# Patient Record
Sex: Male | Born: 1980 | Race: White | Hispanic: Yes | Marital: Single | Smoking: Never smoker
Health system: Southern US, Community
[De-identification: ages and names within clinical notes are randomized; demographics above are authoritative.]

---

## 2019-11-04 ENCOUNTER — Encounter (HOSPITAL_COMMUNITY): Payer: Self-pay | Admitting: Emergency Medicine

## 2019-11-04 ENCOUNTER — Emergency Department (HOSPITAL_COMMUNITY): Payer: Self-pay

## 2019-11-04 ENCOUNTER — Emergency Department (HOSPITAL_COMMUNITY)
Admission: EM | Admit: 2019-11-04 | Discharge: 2019-11-04 | Disposition: A | Discharge status: Home / Self Care | Payer: Self-pay | Attending: Emergency Medicine | Admitting: Emergency Medicine

## 2019-11-04 ENCOUNTER — Other Ambulatory Visit: Payer: Self-pay

## 2019-11-04 DIAGNOSIS — Z23 Encounter for immunization: Secondary | ICD-10-CM | POA: Insufficient documentation

## 2019-11-04 DIAGNOSIS — Y9389 Activity, other specified: Secondary | ICD-10-CM | POA: Insufficient documentation

## 2019-11-04 DIAGNOSIS — W270XXA Contact with workbench tool, initial encounter: Secondary | ICD-10-CM | POA: Insufficient documentation

## 2019-11-04 DIAGNOSIS — S61411A Laceration without foreign body of right hand, initial encounter: Secondary | ICD-10-CM | POA: Insufficient documentation

## 2019-11-04 DIAGNOSIS — Y9259 Other trade areas as the place of occurrence of the external cause: Secondary | ICD-10-CM | POA: Insufficient documentation

## 2019-11-04 DIAGNOSIS — Y990 Civilian activity done for income or pay: Secondary | ICD-10-CM | POA: Insufficient documentation

## 2019-11-04 MED ORDER — OXYCODONE-ACETAMINOPHEN 5-325 MG PO TABS
1.0000 | ORAL_TABLET | Freq: Once | ORAL | Status: AC
  Administered 2019-11-04: 1 via ORAL
  Filled 2019-11-04: qty 1

## 2019-11-04 MED ORDER — IBUPROFEN 600 MG PO TABS
600.0000 mg | ORAL_TABLET | Freq: Four times a day (QID) | ORAL | 0 refills | Status: AC | PRN
Start: 2019-11-04 — End: ?

## 2019-11-04 MED ORDER — LIDOCAINE-EPINEPHRINE (PF) 2 %-1:200000 IJ SOLN
20.0000 mL | Freq: Once | INTRAMUSCULAR | Status: AC
  Administered 2019-11-04: 20 mL
  Filled 2019-11-04: qty 20

## 2019-11-04 MED ORDER — TETANUS-DIPHTH-ACELL PERTUSSIS 5-2.5-18.5 LF-MCG/0.5 IM SUSP
0.5000 mL | Freq: Once | INTRAMUSCULAR | Status: AC
  Administered 2019-11-04: 0.5 mL via INTRAMUSCULAR
  Filled 2019-11-04: qty 0.5

## 2019-11-04 NOTE — Discharge Instructions (Signed)
Please follow-up at urgent care center in 7 days to have your sutures removed.  Take ibuprofen as needed for pain.  Monitor wound closely for any signs of infection.

## 2019-11-04 NOTE — ED Provider Notes (Signed)
[facility] EMERGENCY DEPARTMENT Provider Note   CSN: [id number] Arrival date & time: 11/04/19  1340     History Chief Complaint  Patient presents with  . Extremity Laceration    Steven Rich is a 39 y.o. male.  The history is provided by the patient. The history is limited by a language barrier. A language interpreter was used.     39 year old Hispanic male presented to ED for evaluation of head injury.  History obtained through language interpreter over the phone.  Patient report approximately 4 hours ago he was at work, cutting words using a hand saw.  He report the saw struck a nail with the wood and bounced back striking his hand.  He suffered a laceration to the volar aspect of the right hand.  He report acute onset of pain.  Described as "it just hurts".  Denies any numbness.  Unable to recall last tetanus status.  He report his wounds were washed out by the nurse once he arrived here.  He is left-hand dominant.  He denies any other injury.  Pain is nonradiating.    History reviewed. No pertinent past medical history.  There are no problems to display for this patient.   History reviewed. No pertinent surgical history.     No family history on file.  Social History   Tobacco Use  . Smoking status: Never Smoker  . Smokeless tobacco: Never Used  Substance Use Topics  . Alcohol use: Not Currently  . Drug use: Not Currently    Home Medications Prior to Admission medications   Not on File    Allergies    Patient has no allergy information on record.  Review of Systems   Review of Systems  Constitutional: Negative for fever.  Skin: Positive for wound.  Neurological: Negative for numbness.    Physical Exam Updated Vital Signs BP (!) 128/92 (BP Location: Right Arm)   Pulse 78   Temp 98.5 F (36.9 C) (Oral)   Resp 16   Ht 5\' 2"  (1.575 m)   Wt 73.5 kg   SpO2 100%   BMI 29.63 kg/m   Physical Exam Vitals and nursing note  reviewed.  Constitutional:      General: He is not in acute distress.    Appearance: He is well-developed.  HENT:     Head: Atraumatic.  Eyes:     Conjunctiva/sclera: Conjunctivae normal.  Musculoskeletal:        General: Signs of injury (Right hand: There is a 4 cm laceration noted to the thenar eminence on the hand with tenderness to palpation but intact distal sensation.  Able to move all fingers with slightly decreased thumb adduction secondary to pain.) present.     Cervical back: Neck supple.  Skin:    Findings: No rash.  Neurological:     Mental Status: He is alert.     ED Results / Procedures / Treatments   Labs (all labs ordered are listed, but only abnormal results are displayed) Labs Reviewed - No data to display  EKG None  Radiology DG Hand Complete Right  Result Date: 11/04/2019 CLINICAL DATA:  Laceration EXAM: RIGHT HAND - COMPLETE 3+ VIEW COMPARISON:  None. FINDINGS: No acute displaced fracture or malalignment. No radiopaque foreign body in the soft tissues. Lucent deformities within the second distal phalanx involving the tuft with small lucent lesion at the base of the distal phalanx. IMPRESSION: 1. No acute osseous abnormality. 2. Deformity of the  second distal phalanx, mostly involving the tuft. Findings could be secondary to previous trauma or potential tuft mass such as inclusion cyst. Follow-up nonemergent cross-sectional imaging as clinically indicated. Electronically Signed   By: Jasmine Pang M.D.   On: 11/04/2019 18:38    Procedures .Marland KitchenLaceration Repair  Date/Time: 11/04/2019 6:38 PM Performed by: Fayrene Helper, PA-C Authorized by: Fayrene Helper, PA-C   Consent:    Consent obtained:  Verbal   Consent given by:  Patient   Risks discussed:  Infection, need for additional repair, pain, poor cosmetic result and poor wound healing   Alternatives discussed:  No treatment and delayed treatment Universal protocol:    Procedure explained and questions answered to  patient or proxy's satisfaction: yes     Relevant documents present and verified: yes     Test results available and properly labeled: yes     Imaging studies available: yes     Required blood products, implants, devices, and special equipment available: yes     Site/side marked: yes     Immediately prior to procedure, a time out was called: yes     Patient identity confirmed:  Verbally with patient Anesthesia (see MAR for exact dosages):    Anesthesia method:  Local infiltration   Local anesthetic:  Lidocaine 2% WITH epi Laceration details:    Location:  Hand   Hand location:  R palm   Length (cm):  4   Depth (mm):  3 Repair type:    Repair type:  Complex Pre-procedure details:    Preparation:  Patient was prepped and draped in usual sterile fashion and imaging obtained to evaluate for foreign bodies Exploration:    Limited defect created (wound extended): yes     Hemostasis achieved with:  Epinephrine and direct pressure   Wound exploration: wound explored through full range of motion and entire depth of wound probed and visualized     Wound extent: no muscle damage noted, no nerve damage noted, no tendon damage noted and no underlying fracture noted     Contaminated: no   Treatment:    Area cleansed with:  Betadine and saline   Amount of cleaning:  Standard   Irrigation solution:  Sterile saline   Visualized foreign bodies/material removed: no     Debridement:  Minimal   Undermining:  Minimal   Scar revision: no   Skin repair:    Repair method:  Sutures   Suture size:  5-0   Suture material:  Prolene   Suture technique:  Simple interrupted   Number of sutures:  8 Approximation:    Approximation:  Close Post-procedure details:    Dressing:  Non-adherent dressing   Patient tolerance of procedure:  Tolerated well, no immediate complications   (including critical care time)  Medications Ordered in ED Medications  oxyCODONE-acetaminophen (PERCOCET/ROXICET) 5-325 MG per  tablet 1 tablet (1 tablet Oral Given 11/04/19 1840)  lidocaine-EPINEPHrine (XYLOCAINE W/EPI) 2 %-1:200000 (PF) injection 20 mL (20 mLs Infiltration Given by Other 11/04/19 1802)  Tdap (BOOSTRIX) injection 0.5 mL (0.5 mLs Intramuscular Given 11/04/19 1840)    ED Course  I have reviewed the triage vital signs and the nursing notes.  Pertinent labs & imaging results that were available during my care of the patient were reviewed by me and considered in my medical decision making (see chart for details).    MDM Rules/Calculators/A&P  BP (!) 128/92 (BP Location: Right Arm)   Pulse 78   Temp 98.5 F (36.9 C) (Oral)   Resp 16   Ht 5\' 2"  (1.575 m)   Wt 73.5 kg   SpO2 100%   BMI 29.63 kg/m   Final Clinical Impression(s) / ED Diagnoses Final diagnoses:  Laceration of right hand without foreign body, initial encounter    Rx / DC Orders ED Discharge Orders         Ordered    ibuprofen (ADVIL) 600 MG tablet  Every 6 hours PRN     Discontinue  Reprint     11/04/19 1847         6:40 PM Patient suffered laceration to his right nondominant hand at work today with a hand saw.  He has a 4 cm laceration to the volar aspect of his right hand overlying the thenar eminence.  Wound is jagged, requiring undermining, and surgical debridement using sterile scissor.  Laceration repaired using 5.0 Prolene.  No evidence of bony or nerve injury.  No obvious muscle injury.  X-ray negative.  Patient will need to have sutures removed in 7 days.  Pain medication prescribed.  Return precaution discussed.  X-ray demonstrate a deformity at the second digit.  This is an old injury,   01/05/20 11/04/19 01/05/20, MD 11/04/19 01/05/20

## 2019-11-04 NOTE — ED Notes (Signed)
Discharge instructions including signs of infection discussed with pt via phone interpreter. Pt verbalized understanding of discharge instructions, discharged from ED in NAD.

## 2019-11-04 NOTE — ED Triage Notes (Signed)
Pt. Stated, I cut with saw cross rt. anterior hand

## 2020-11-06 IMAGING — CR DG HAND COMPLETE 3+V*R*
3 series · 3 of 3 positions shown · non-contrast
Comparison: None.

CLINICAL DATA: Laceration

EXAM:
RIGHT HAND - COMPLETE 3+ VIEW

[hand pa]
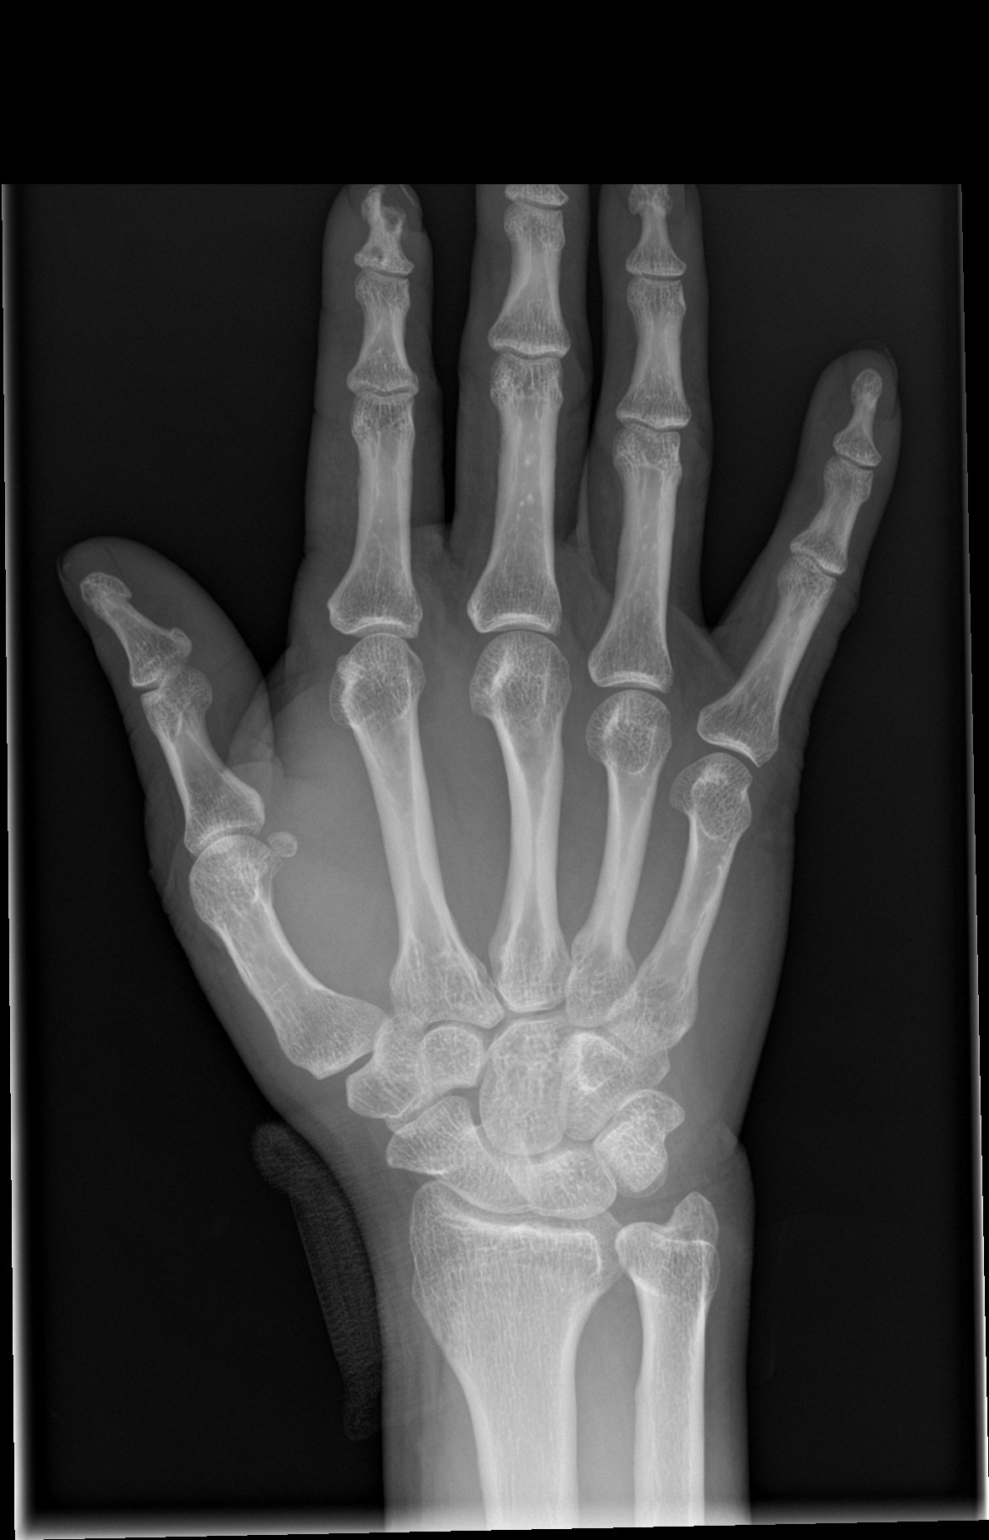

[hand obl]
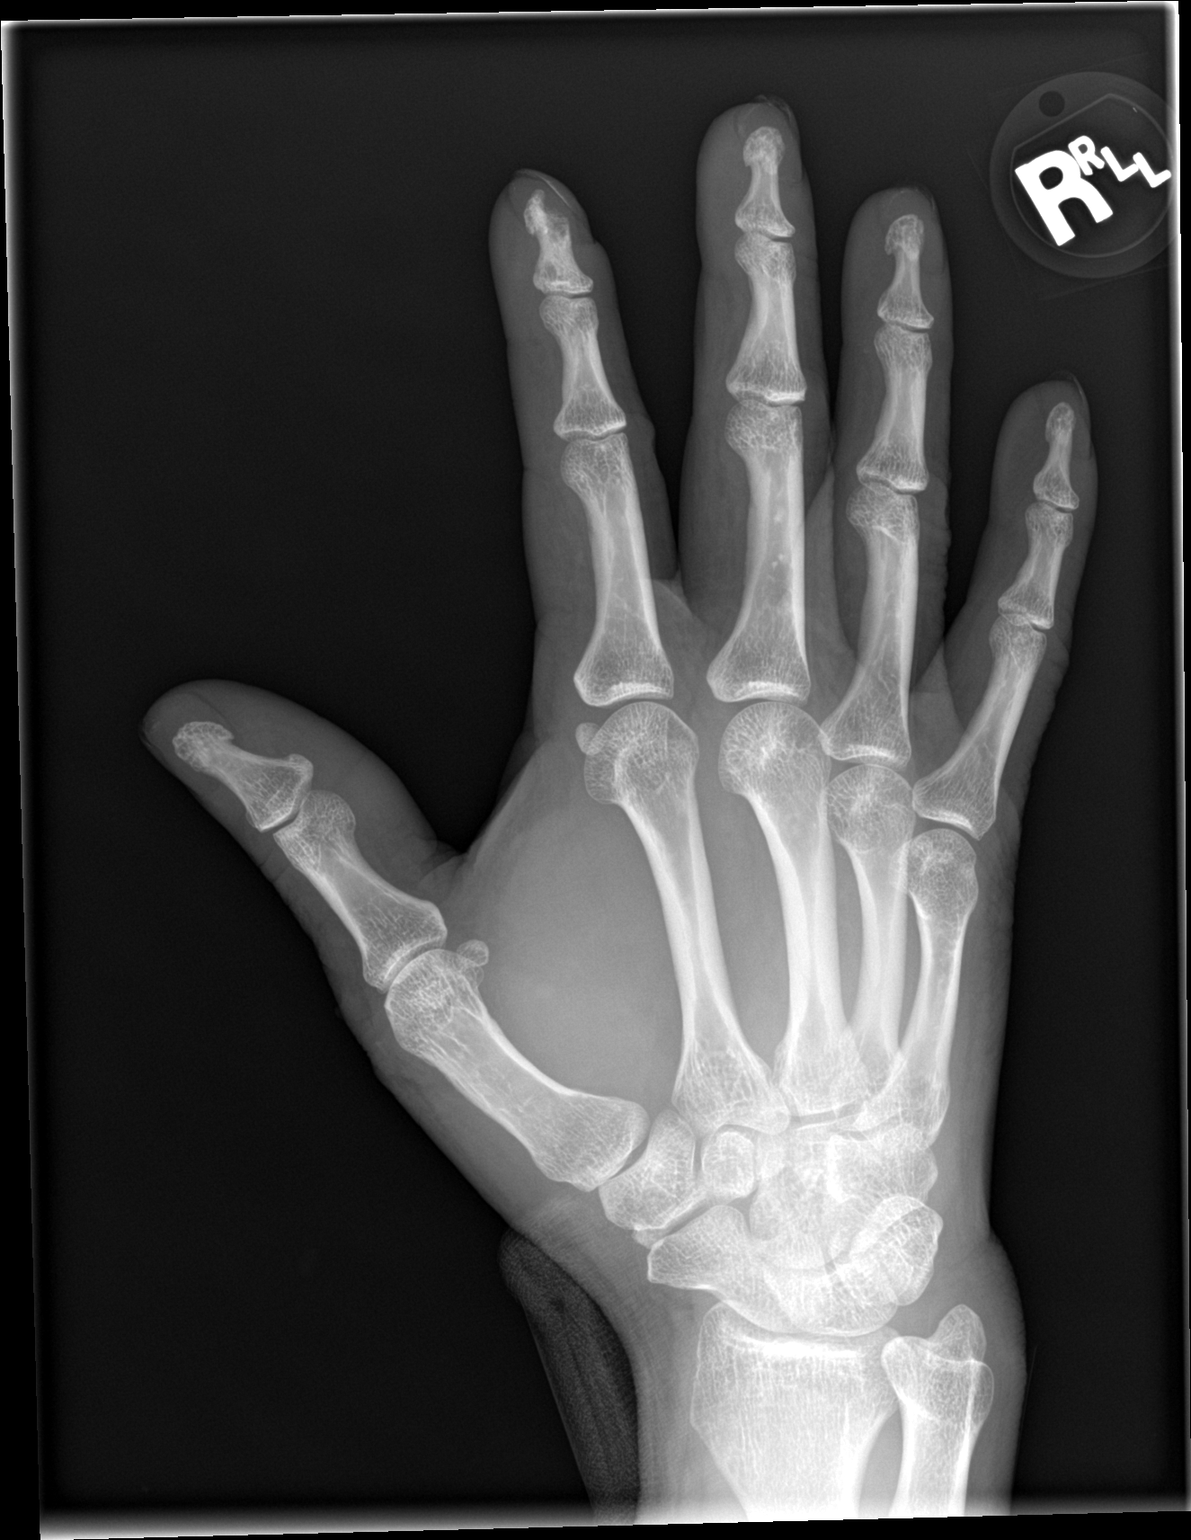

[hand lat]
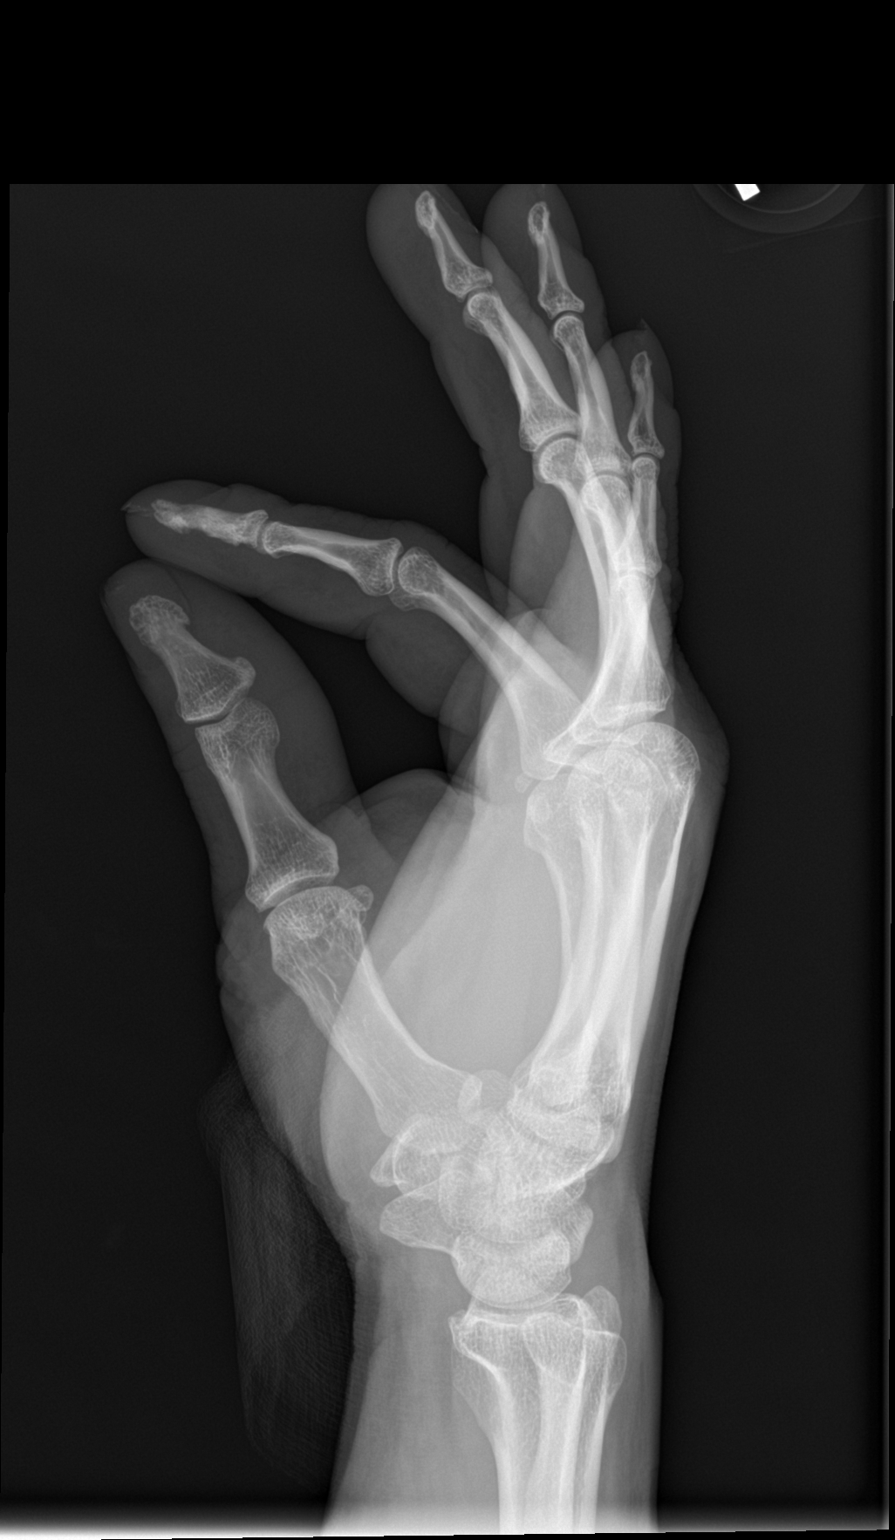

[3 of 3 positions shown; findings below may reference images not displayed]

FINDINGS: No acute displaced fracture or malalignment. No radiopaque foreign
body in the soft tissues. Lucent deformities within the second
distal phalanx involving the tuft with small lucent lesion at the
base of the distal phalanx.
IMPRESSION: 1. No acute osseous abnormality.
2. Deformity of the second distal phalanx, mostly involving the
tuft. Findings could be secondary to previous trauma or potential
tuft mass such as inclusion cyst. Follow-up nonemergent
cross-sectional imaging as clinically indicated.
